# Patient Record
Sex: Female | Born: 1995 | Race: White | Hispanic: No | Smoking: Former smoker
Health system: Southern US, Community
[De-identification: ages and names within clinical notes are randomized; demographics above are authoritative.]

## PROBLEM LIST (undated history)

## (undated) DIAGNOSIS — L509 Urticaria, unspecified: Secondary | ICD-10-CM

## (undated) DIAGNOSIS — O210 Mild hyperemesis gravidarum: Secondary | ICD-10-CM

## (undated) HISTORY — DX: Urticaria, unspecified: L50.9

---

## 2017-04-18 ENCOUNTER — Ambulatory Visit: Payer: Self-pay | Admitting: Allergy and Immunology

## 2017-04-18 ENCOUNTER — Encounter: Payer: Self-pay | Admitting: Allergy and Immunology

## 2017-04-18 VITALS — BP 110/70 | HR 84 | Temp 98.6°F | Resp 20 | Ht 70.0 in | Wt 177.0 lb

## 2017-04-18 DIAGNOSIS — L500 Allergic urticaria: Secondary | ICD-10-CM

## 2017-04-18 DIAGNOSIS — F17210 Nicotine dependence, cigarettes, uncomplicated: Secondary | ICD-10-CM

## 2017-04-18 NOTE — Progress Notes (Signed)
Dear Dr. Philipp DeputyVollmer,  Thank you for referring Carla [name] to the [facility] [place] on 04/18/2017.   Below is a summation of this patient's evaluation and recommendations.  Thank you for your referral. I will keep you informed about this patient's response to treatment.   If you have any questions please do not hesitate to contact [name].   Sincerely,  Jessica PriestEric J. Avi Kerschner, MD Allergy / Immunology [facility]   ______________________________________________________________________    NEW PATIENT NOTE  Referring Provider: Yisroel RammingVollmer, Kelly, MD Primary Provider: Yisroel RammingVollmer, Kelly, MD Date of office visit: 04/18/2017    Subjective:   Chief Complaint:  Carla [name] (DOB: 1996-03-11) is a 21 y.o. female who presents to the clinic on 04/18/2017 with a chief complaint of Allergic Reaction .     HPI: Carla [name] presents to this clinic in evaluation of a allergic reaction that occurred 1-2 months ago.  Apparently she developed red raised itchy lesions across her body with a global distribution that were intensely itchy without any associated systemic or constitutional symptoms and without any healing with scar or hyperpigmentation lasting approximately 48 hours.  She did go to the emergency room within the initial 24 hours and was treated with systemic steroids.  There was no obvious provoking factor giving rise to this reaction.  She did not use any new medications or have any unusual environmental exposures or start any new supplements or over-the-counter agents.  She did have placement of an IUD 10 August 2016 but has had no difficulty with this form of contraception.  Past Medical History:  Diagnosis Date  . Urticaria     History reviewed. No pertinent surgical history.  Allergies as of 04/18/2017   No Known Allergies     Medication List     none    Review of systems negative except as noted in HPI / PMHx or  noted below:  Review of Systems  Constitutional: Negative.   HENT: Negative.   Eyes: Negative.   Respiratory: Negative.   Cardiovascular: Negative.   Gastrointestinal: Negative.   Genitourinary: Negative.   Musculoskeletal: Negative.   Skin: Negative.   Neurological: Negative.   Endo/Heme/Allergies: Negative.   Psychiatric/Behavioral: Negative.     Family History  Problem Relation Age of Onset  . Diabetes Paternal Aunt   . Diabetes Paternal Uncle   . Diabetes Paternal Grandmother     Social History   Socioeconomic History  . Marital status: Single    Spouse name: Not on file  . Number of children: Not on file  . Years of education: Not on file  . Highest education level: Not on file  Social Needs  . Financial resource strain: Not on file  . Food insecurity - worry: Not on file  . Food insecurity - inability: Not on file  . Transportation needs - medical: Not on file  . Transportation needs - non-medical: Not on file  Occupational History  . Not on file  Tobacco Use  . Smoking status: Current Every Day Smoker    Years: 4.00    Types: Cigarettes  . Smokeless tobacco: Never Used  . Tobacco comment: Smokes about 2 cigarettes a day  Substance and Sexual Activity  . Alcohol use: No    Frequency: Never  . Drug use: No  . Sexual activity: Not on file  Other Topics Concern  . Not on file  Social History Narrative  . Not  on file    Environmental and Social history  Lives in a house with a dry environment, cats and dogs located inside the household, carpet in the bedroom, no plastic on the bed, no plastic on the pillow, actively smoking tobacco products, and employment as a Conservation officer, naturecashier.  Objective:   Vitals:   04/18/17 0926  BP: 110/70  Pulse: 84  Resp: 20  Temp: 98.6 F (37 C)   Height: 5\' 10"  (177.8 cm) Weight: 177 lb (80.3 kg)  Physical Exam  Constitutional: She is well-developed, well-nourished, and in no distress.  HENT:  Head: Normocephalic. Head is  without right periorbital erythema and without left periorbital erythema.  Right Ear: Tympanic membrane, external ear and ear canal normal.  Left Ear: Tympanic membrane, external ear and ear canal normal.  Nose: Nose normal. No mucosal edema or rhinorrhea.  Mouth/Throat: Oropharynx is clear and moist and mucous membranes are normal. No oropharyngeal exudate.  Eyes: Conjunctivae and lids are normal. Pupils are equal, round, and reactive to light.  Neck: Trachea normal. No tracheal deviation present. No thyromegaly present.  Cardiovascular: Normal rate, regular rhythm, S1 normal, S2 normal and normal heart sounds.  No murmur heard. Pulmonary/Chest: Effort normal. No stridor. No tachypnea. No respiratory distress. She has no wheezes. She has no rales. She exhibits no tenderness.  Abdominal: Soft. She exhibits no distension and no mass. There is no hepatosplenomegaly. There is no tenderness. There is no rebound and no guarding.  Musculoskeletal: She exhibits no edema or tenderness.  Lymphadenopathy:       Head (right side): No tonsillar adenopathy present.       Head (left side): No tonsillar adenopathy present.    She has no cervical adenopathy.    She has no axillary adenopathy.  Neurological: She is alert. Gait normal.  Skin: No rash noted. She is not diaphoretic. No erythema. No pallor. Nails show no clubbing.  Psychiatric: Mood and affect normal.    Diagnostics: Allergy skin tests were performed.  He did not demonstrate any hypersensitivity against a screening panel of aeroallergens or foods.  Assessment and Plan:    1. Allergic urticaria   2. Tobacco smoker, less than 10 cigarettes per day     1.  Allergen avoidance measures?  2. Auvi-Q 0.3, Benadryl, MD/ER evaluation for allergic reaction  3.  Blood - CBC w/diff, CMP, alpha-gal   4. Further evaluation? Yes, if recurrent  5. Use nicotine substitutes to replace tobacco smoke exposure  6.  Obtain an annual fall flu  vaccine  Carla [name] if she develops another episode of immunological hyperreactivity in the future.  Hopefully this recent episode was a isolated event and will not be recurrent.  To be complete we will screen her blood to check major organ function and to rule out alpha gal syndrome.  I will contact her with the results of these blood tests once they are available for review.  She will contact [name] should she have recurrent reactions.  Jessica PriestEric J. Rayssa Atha, MD Allergy / Immunology [facility] [place] [place]

## 2017-04-18 NOTE — Patient Instructions (Addendum)
  1.  Allergen avoidance measures?  2. Auvi-Q 0.3, Benadryl, MD/ER evaluation for allergic reaction  3.  Blood - CBC w/diff, CMP, alpha-gal   4. Further evaluation? Yes, if recurrent  5. Use nicotine substitutes to replace tobacco smoke exposure  6.  Obtain an annual fall flu vaccine

## 2017-04-22 ENCOUNTER — Encounter: Payer: Self-pay | Admitting: Allergy and Immunology

## 2017-04-26 MED ORDER — EPINEPHRINE 0.3 MG/0.3ML IJ SOAJ: 0.3000 mg | Freq: Once | INTRAMUSCULAR | 1 refills | Status: AC

## 2017-04-26 NOTE — Addendum Note (Signed)
Addended by: Mliss FritzBLACK, Kazuo Durnil I on: 04/26/2017 10:53 AM   Modules accepted: Orders

## 2017-05-30 ENCOUNTER — Telehealth: Payer: Self-pay

## 2017-05-30 NOTE — Telephone Encounter (Signed)
We received a fax from [facility] stating that they have not been able to reach the patient.  I called Carla [name] and gave [facility] the [facility] number [phone] and asked her to call them since she has been approved for the AUVI-Q.

## 2018-01-20 DIAGNOSIS — S91331A Puncture wound without foreign body, right foot, initial encounter: Secondary | ICD-10-CM | POA: Diagnosis not present

## 2018-07-01 DIAGNOSIS — Z30432 Encounter for removal of intrauterine contraceptive device: Secondary | ICD-10-CM | POA: Diagnosis not present

## 2018-07-17 DIAGNOSIS — F411 Generalized anxiety disorder: Secondary | ICD-10-CM | POA: Diagnosis not present

## 2018-07-17 DIAGNOSIS — F331 Major depressive disorder, recurrent, moderate: Secondary | ICD-10-CM | POA: Diagnosis not present

## 2018-07-17 DIAGNOSIS — Z1331 Encounter for screening for depression: Secondary | ICD-10-CM | POA: Diagnosis not present

## 2018-07-17 DIAGNOSIS — E663 Overweight: Secondary | ICD-10-CM | POA: Diagnosis not present

## 2018-07-31 DIAGNOSIS — E663 Overweight: Secondary | ICD-10-CM | POA: Diagnosis not present

## 2018-07-31 DIAGNOSIS — F331 Major depressive disorder, recurrent, moderate: Secondary | ICD-10-CM | POA: Diagnosis not present

## 2018-07-31 DIAGNOSIS — F411 Generalized anxiety disorder: Secondary | ICD-10-CM | POA: Diagnosis not present

## 2018-11-12 DIAGNOSIS — Z20828 Contact with and (suspected) exposure to other viral communicable diseases: Secondary | ICD-10-CM | POA: Diagnosis not present

## 2018-11-18 ENCOUNTER — Other Ambulatory Visit: Payer: Self-pay

## 2018-11-18 ENCOUNTER — Emergency Department (INDEPENDENT_AMBULATORY_CARE_PROVIDER_SITE_OTHER): Payer: BC Managed Care – PPO

## 2018-11-18 ENCOUNTER — Emergency Department (INDEPENDENT_AMBULATORY_CARE_PROVIDER_SITE_OTHER)
Admission: EM | Admit: 2018-11-18 | Discharge: 2018-11-18 | Disposition: A | Discharge status: Home / Self Care | Payer: BC Managed Care – PPO | Source: Home / Self Care

## 2018-11-18 DIAGNOSIS — R079 Chest pain, unspecified: Secondary | ICD-10-CM | POA: Diagnosis not present

## 2018-11-18 DIAGNOSIS — R0789 Other chest pain: Secondary | ICD-10-CM

## 2018-11-18 DIAGNOSIS — R5383 Other fatigue: Secondary | ICD-10-CM

## 2018-11-18 DIAGNOSIS — R0602 Shortness of breath: Secondary | ICD-10-CM

## 2018-11-18 NOTE — ED Triage Notes (Signed)
Pt was tested last Wednesday for covid.  Negative, still having SOB and fatigue.  Muscle cramps and vomiting have resolved.

## 2018-11-18 NOTE — ED Provider Notes (Signed)
[facility]    CSN: [id number] Arrival date & time: 11/18/18  1059     History   Chief Complaint Chief Complaint  Patient presents with  . Shortness of Breath  . Fatigue    HPI Carla [name] is a 23 y.o. female.   HPI Carla [name] is a 23 y.o. female presenting to UC with c/o mild fatigue and shortness of breath that started about 1 week ago. Pt did complete a drive-by Covid test, which came back negative, along with her partner who also tested negative last week. Pt [place] and were not wearing masks most of the time.  She did have some muscle cramps and mild nausea with 2 episodes of vomiting last week, which have since resolved. No medication taken at home.  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  Her last CXR was 4 years ago and she was advised she should have one about every 4 years. She does not have a PCP at this time. Denies cough, congestion, hemoptysis, or night sweats.    Past Medical History:  Diagnosis Date  . Urticaria     There are no active problems to display for this patient.   History reviewed. No pertinent surgical history.  OB History   No obstetric history on file.      Home Medications    Prior to Admission medications   Medication Sig Start Date End Date Taking? Authorizing Provider  Venlafaxine HCl 37.5 MG TB24  10/08/18   [provider]    Family History Family History  Problem Relation Age of Onset  . Diabetes Paternal Aunt   . Diabetes Paternal Uncle   . Diabetes Paternal Grandmother     Social History Social History   Tobacco Use  . Smoking status: Current Every Day Smoker    Years: 4.00    Types: Cigarettes  . Smokeless tobacco: Never Used  . Tobacco comment: Smokes about 2 cigarettes a day  Substance Use Topics  . Alcohol use: No    Frequency: Never  . Drug use: No     Allergies   Patient has no known allergies.   Review of Systems  Review of Systems  Constitutional: Positive for fatigue. Negative for chills, diaphoresis and fever.  HENT: Negative for congestion, ear pain, sore throat, trouble swallowing and voice change.   Respiratory: Positive for chest tightness and shortness of breath. Negative for cough.   Cardiovascular: Negative for chest pain and palpitations.  Gastrointestinal: Negative for abdominal pain, diarrhea, nausea and vomiting.  Musculoskeletal: Negative for arthralgias, back pain and myalgias.  Skin: Negative for rash.  Neurological: Negative for dizziness, light-headedness and headaches.     Physical Exam Triage Vital Signs ED Triage Vitals  Enc Vitals Group     BP      Pulse      Resp      Temp      Temp src      SpO2      Weight      Height      Head Circumference      Peak Flow      Pain Score      Pain Loc      Pain Edu?      Excl. in GC?    No data found.  Updated Vital Signs BP 119/83 (BP Location: Right Arm)   Pulse 75   Temp  98.7 F (37.1 C) (Oral)   Resp 20   Ht 6' (1.829 m)   Wt 170 lb (77.1 kg)   LMP 11/04/2018   SpO2 100%   BMI 23.06 kg/m   Visual Acuity Right Eye Distance:   Left Eye Distance:   Bilateral Distance:    Right Eye Near:   Left Eye Near:    Bilateral Near:     Physical Exam Vitals signs and nursing note reviewed.  Constitutional:      General: She is not in acute distress.    Appearance: She is well-developed. She is not ill-appearing, toxic-appearing or diaphoretic.  HENT:     Head: Normocephalic and atraumatic.     Right Ear: Tympanic membrane normal.     Left Ear: Tympanic membrane normal.     Nose: Nose normal.     Right Sinus: No maxillary sinus tenderness or frontal sinus tenderness.     Left Sinus: No maxillary sinus tenderness or frontal sinus tenderness.     Mouth/Throat:     Lips: Pink.     Mouth: Mucous membranes are moist.     Pharynx: Oropharynx is clear. Uvula midline.  Neck:     Musculoskeletal: Normal range of  motion and neck supple.  Cardiovascular:     Rate and Rhythm: Normal rate and regular rhythm.  Pulmonary:     Effort: Pulmonary effort is normal.     Breath sounds: Normal breath sounds. No decreased breath sounds, wheezing, rhonchi or rales.  Musculoskeletal: Normal range of motion.  Skin:    General: Skin is warm and dry.  Neurological:     Mental Status: She is alert and oriented to person, place, and time.  Psychiatric:        Behavior: Behavior normal.      UC Treatments / Results  Labs (all labs ordered are listed, but only abnormal results are displayed) Labs Reviewed - No data to display  EKG   Radiology Dg Chest 2 View  Result Date: 11/18/2018 CLINICAL DATA:  Chest pain. EXAM: CHEST - 2 VIEW COMPARISON:  None. FINDINGS: The heart size and mediastinal contours are within normal limits. Both lungs are clear. No pneumothorax or pleural effusion is noted. The visualized skeletal structures are unremarkable. IMPRESSION: No active cardiopulmonary disease. Electronically Signed   By: Marijo Conception M.D.   On: 11/18/2018 11:36    Procedures Procedures (including critical care time)  Medications Ordered in UC Medications - No data to display  Initial Impression / Assessment and Plan / UC Course  I have reviewed the triage vital signs and the nursing notes.  Pertinent labs & imaging results that were available during my care of the patient were reviewed by me and considered in my medical decision making (see chart for details).     Pt appears well, NAD. Vitals are normal including O2 Sat 100% on RA Reassured pt of normal vitals and CXR Encouraged good hydration and rest Encouraged to reestablish care with a PCP AVS provided.  Final Clinical Impressions(s) / UC Diagnoses   Final diagnoses:  Shortness of breath  Chest tightness  Fatigue, unspecified type     Discharge Instructions      Be sure to stay well hydrated and get plenty of sleep, at least 8  hours, at night.  It is highly recommended you re-establish care with a primary care provider for ongoing healthcare needs and recheck of symptoms next week if not improving.  Call 911 or have someone  drive you to the closest hospital if symptoms worsening.     ED Prescriptions    None     Controlled Substance Prescriptions [place] Controlled Substance Registry consulted? Not Applicable   Rolla Platehelps, Tavone Caesar O, PA-C 11/18/18 1228

## 2018-11-18 NOTE — Discharge Instructions (Signed)
°  Be sure to stay well hydrated and get plenty of sleep, at least 8 hours, at night.  It is highly recommended you re-establish care with a primary care provider for ongoing healthcare needs and recheck of symptoms next week if not improving.  Call 911 or have someone drive you to the closest hospital if symptoms worsening.

## 2020-05-20 ENCOUNTER — Other Ambulatory Visit: Payer: Self-pay

## 2020-05-20 ENCOUNTER — Emergency Department (INDEPENDENT_AMBULATORY_CARE_PROVIDER_SITE_OTHER)
Admission: EM | Admit: 2020-05-20 | Discharge: 2020-05-20 | Disposition: A | Discharge status: Home / Self Care | Payer: Self-pay | Source: Home / Self Care | Attending: Emergency Medicine | Admitting: Emergency Medicine

## 2020-05-20 DIAGNOSIS — R82998 Other abnormal findings in urine: Secondary | ICD-10-CM

## 2020-05-20 DIAGNOSIS — O210 Mild hyperemesis gravidarum: Secondary | ICD-10-CM

## 2020-05-20 HISTORY — DX: Mild hyperemesis gravidarum: O21.0

## 2020-05-20 LAB — POCT URINALYSIS DIP (MANUAL ENTRY)
Blood, UA: NEGATIVE
Glucose, UA: NEGATIVE mg/dL
Nitrite, UA: NEGATIVE
Protein Ur, POC: 30 mg/dL — AB
Spec Grav, UA: >=1.03 — AB (ref 1.010–1.025)
Urobilinogen, UA: 4 E.U./dL — AB
pH, UA: 6 (ref 5.0–8.0)

## 2020-05-20 MED ORDER — CEPHALEXIN 500 MG PO CAPS: 500.0000 mg | ORAL_CAPSULE | Freq: Two times a day (BID) | ORAL | 0 refills | Status: AC

## 2020-05-20 MED ORDER — PROMETHAZINE HCL 25 MG RE SUPP: 25.0000 mg | Freq: Four times a day (QID) | RECTAL | 0 refills | Status: AC | PRN

## 2020-05-20 MED ORDER — DOXYLAMINE-PYRIDOXINE 10-10 MG PO TBEC: 2.0000 | DELAYED_RELEASE_TABLET | Freq: Every day | ORAL | 0 refills | Status: AC

## 2020-05-20 NOTE — Discharge Instructions (Signed)
Try the Diclegis. Small, frequent meals. Push electrolyte containing fluids such as Pedialyte. He has some leukocytes in your urine, because you have some tenderness in the suprapubic and flank regions, I am going to treat you as if you have a urinary tract infection. I am going to send your urine off for culture to make sure that we have you on the right antibiotic and to confirm the diagnosis. Go to maternity admissions unit at [facility] if you get worse, signs of significant dehydration such as no urine output in 12 hours, fevers above 100.4, or further concerns

## 2020-05-20 NOTE — ED Triage Notes (Signed)
Pt presents for excessive emesis related to 7 weeks pregnancy. EDD August 29th. Pt does not have OB or prenatal care as of yet. First appt scheduled for next week.

## 2020-05-20 NOTE — ED Provider Notes (Signed)
HPI  SUBJECTIVE:  Carla [name] is a G2 1 25 y.o. female who presents with Daily, constant nausea and 8-10 episodes nonbilious nonbloody emesis during the day.  She is able to tolerate small amounts of p.o. intake "sometimes".  She reports decreased urine output.  She states that her abdomen is "sore" secondary to multiple vomiting.  She states this is identical to previous hyperemesis gravidarum when she was pregnant last time.  States that she had hyperemesis gravidarum for the entire pregnancy.  She states that she was given Phenergan with good control of her symptoms.  She is currently 7 3/[redacted] weeks pregnant by LMP.  She denies vaginal bleeding, contractions, leakage of fluid.  She has tried over-the-counter nausea vomiting remedies, vitamin B6, Zofran, Diclegis in the past and states that "nothing works".  Phenergan helps.  Symptoms are worse when she tries to eat or drink.  She has a past medical history of hyperemesis gravidarum, UTI.  No history of diabetes, abdominal surgeries, hypertension pyelonephritis, nephrolithiasis.  LMP: 11/23.  She has not yet had an ultrasound or prenatal care.  She is she is establishing care with [facility] on 1/27.  Past Medical History:  Diagnosis Date  . Hyperemesis gravidarum   . Urticaria     History reviewed. No pertinent surgical history.  Family History  Problem Relation Age of Onset  . Diabetes Paternal Aunt   . Diabetes Paternal Uncle   . Diabetes Paternal Grandmother     Social History   Tobacco Use  . Smoking status: Former Smoker    Years: 4.00    Types: Cigarettes    Quit date: 05/21/2019    Years since quitting: 1.0  . Smokeless tobacco: Never Used  Vaping Use  . Vaping Use: Never used  Substance Use Topics  . Alcohol use: No  . Drug use: No    No current facility-administered medications for this encounter.  Current Outpatient Medications:  .  cephALEXin (KEFLEX) 500 MG capsule, Take 1 capsule (500 mg total) by mouth 2 (two)  times daily., Disp: 14 capsule, Rfl: 0 .  Doxylamine-Pyridoxine 10-10 MG TBEC, Take 2 tablets by mouth at bedtime. Start 2 tabs at night.  If symptoms persist after 2 days, increase to 1 tab in the morning and 2 tabs at night.  May increase to 1 tab in the morning, 1 tab in the afternoon and 2 tabs at night., Disp: 60 tablet, Rfl: 0 .  promethazine (PHENERGAN) 25 MG suppository, Place 1 suppository (25 mg total) rectally every 6 (six) hours as needed for up to 5 days for nausea or vomiting., Disp: 20 each, Rfl: 0  No Known Allergies   ROS  As noted in HPI.   Physical Exam  BP 134/82 (BP Location: Left Arm)   Pulse 82   Temp 98.4 F (36.9 C) (Oral)   Resp 18   Ht 6' (1.829 m)   Wt 87.7 kg   SpO2 98%   BMI 26.23 kg/m   Constitutional: Well developed, well nourished, no acute distress Eyes:  EOMI, conjunctiva normal bilaterally HENT: Normocephalic, atraumatic,mucus membranes moist Respiratory: Normal inspiratory effort Cardiovascular: Normal rate. Capillary refill less than 2 seconds GI: nondistended. Positive suprapubic, right flank tenderness Back: No CVA tenderness skin: No rash, skin intact Musculoskeletal: no deformities Neurologic: Alert & oriented x 3, no focal neuro deficits Psychiatric: Speech and behavior appropriate   ED Course   Medications - No data to display  Orders Placed This Encounter  Procedures  .  Urine culture    Standing Status:   Standing    Number of Occurrences:   1  . POCT urinalysis dipstick    Standing Status:   Standing    Number of Occurrences:   1    No results found for this or any previous visit (from the past 24 hour(s)). No results found.  ED Clinical Impression  1. Hyperemesis gravidarum   2. Leukocytes in urine      ED Assessment/Plan  Will check a UA because of suprapubic, flank tenderness.  She was trace leukocytes, but is also concentrated urine, moderate ketones consistent with hyperemesis gravidarum. We will send  this off for culture. Sending home with Keflex 500 mg twice daily for UTI because of the suprapubic, flank tenderness. Her abdomen is benign, she has no evidence for surgical abdomen.  Plan to send home with Diclegis for her to try since she is in her first trimester.  If that does not work, Phenergan suppositories 25 mg every 4 hours as needed.  Small meals, push electrolyte containing fluids. Follow-up with OB as scheduled on 1/27.  Discussed labs,  MDM, treatment plan, and plan for follow-up with patient. Discussed sn/sx that should prompt return to the ED. patient agrees with plan.   Meds ordered this encounter  Medications  . cephALEXin (KEFLEX) 500 MG capsule    Sig: Take 1 capsule (500 mg total) by mouth 2 (two) times daily.    Dispense:  14 capsule    Refill:  0  . Doxylamine-Pyridoxine 10-10 MG TBEC    Sig: Take 2 tablets by mouth at bedtime. Start 2 tabs at night.  If symptoms persist after 2 days, increase to 1 tab in the morning and 2 tabs at night.  May increase to 1 tab in the morning, 1 tab in the afternoon and 2 tabs at night.    Dispense:  60 tablet    Refill:  0  . promethazine (PHENERGAN) 25 MG suppository    Sig: Place 1 suppository (25 mg total) rectally every 6 (six) hours as needed for up to 5 days for nausea or vomiting.    Dispense:  20 each    Refill:  0    *This clinic note was created using Scientist, clinical (histocompatibility and immunogenetics). Therefore, there may be occasional mistakes despite careful proofreading.   ?    Domenick Gong, MD 05/21/20 1209

## 2020-05-21 ENCOUNTER — Ambulatory Visit: Payer: Self-pay

## 2020-05-21 LAB — URINE CULTURE
MICRO NUMBER:: 11419907
SPECIMEN QUALITY:: ADEQUATE

## 2020-06-01 IMAGING — DX CHEST - 2 VIEW
2 series · 2 of 2 positions shown · non-contrast
Comparison: None.

CLINICAL DATA: Chest pain.

EXAM:
CHEST - 2 VIEW

[chest pa]
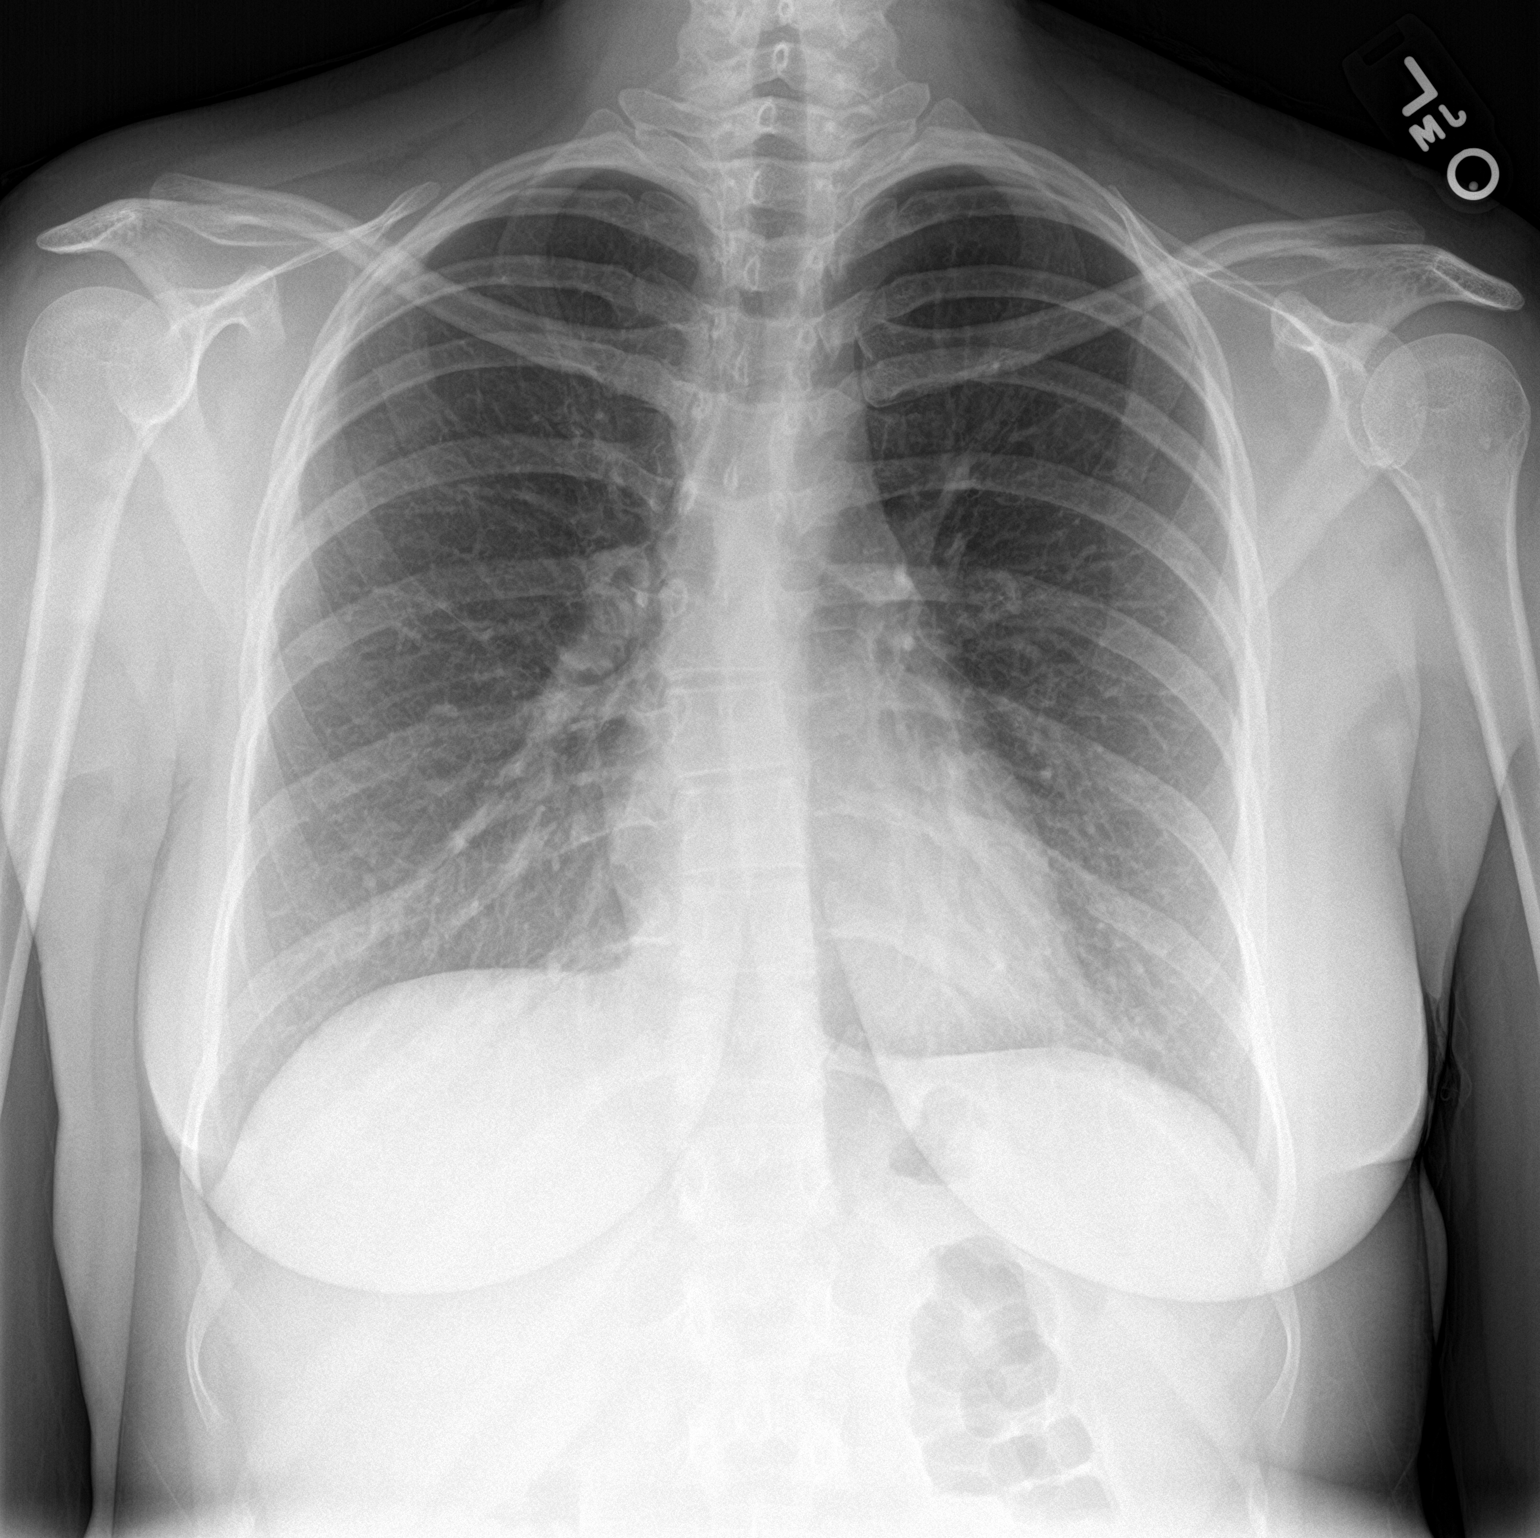

[chest lat]
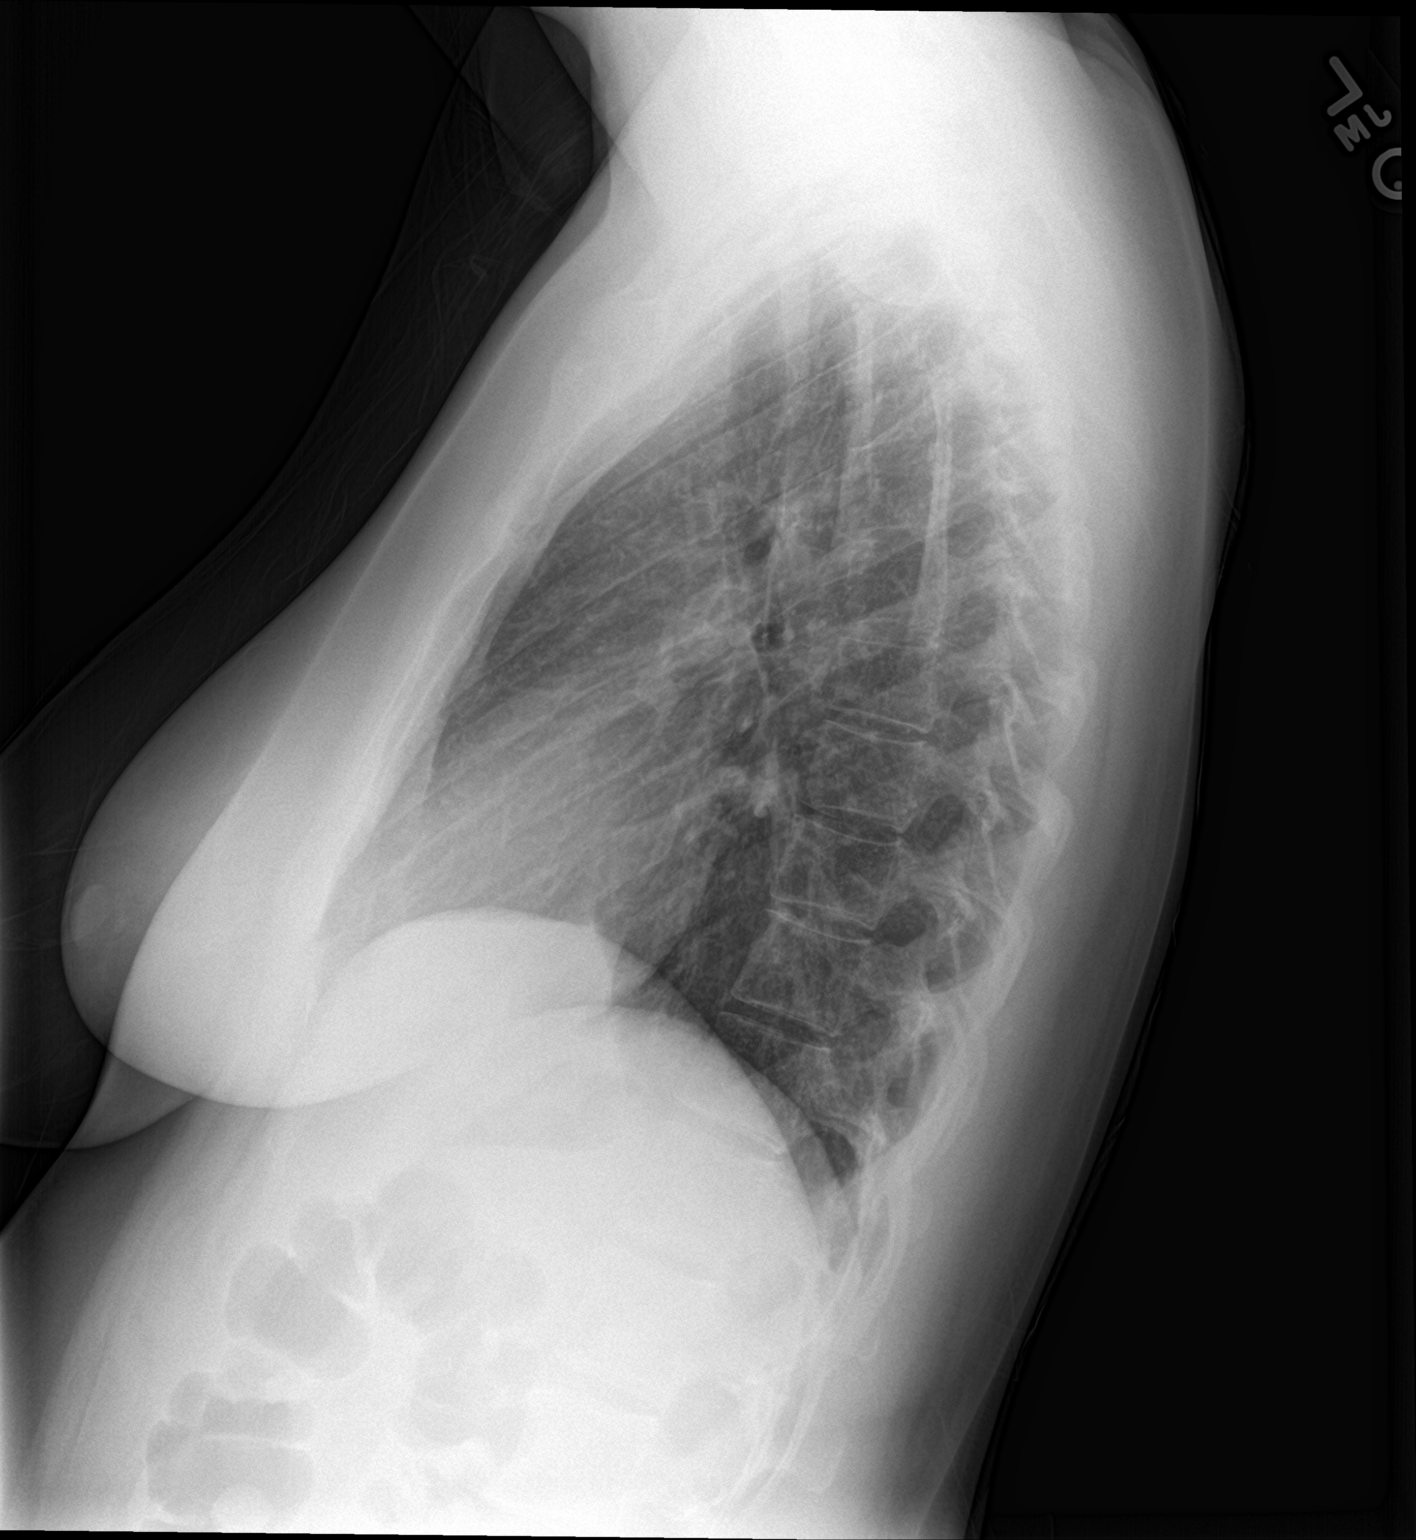

[2 of 2 positions shown; findings below may reference images not displayed]

FINDINGS: The heart size and mediastinal contours are within normal limits.
Both lungs are clear. No pneumothorax or pleural effusion is noted.
The visualized skeletal structures are unremarkable.
IMPRESSION: No active cardiopulmonary disease.
